# Patient Record
Sex: Female | Born: 1985 | Race: Black or African American | Hispanic: No | Marital: Single | Smoking: Never smoker
Health system: Southern US, Community
[De-identification: ages and names within clinical notes are randomized; demographics above are authoritative.]

---
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
## 2014-07-27 ENCOUNTER — Encounter (HOSPITAL_COMMUNITY): Payer: Self-pay | Admitting: Emergency Medicine

## 2014-07-27 ENCOUNTER — Emergency Department (HOSPITAL_COMMUNITY)
Admission: EM | Admit: 2014-07-27 | Discharge: 2014-07-27 | Disposition: A | Discharge status: Home / Self Care | Payer: Self-pay | Attending: Emergency Medicine | Admitting: Emergency Medicine

## 2014-07-27 DIAGNOSIS — IMO0001 Reserved for inherently not codable concepts without codable children: Secondary | ICD-10-CM | POA: Insufficient documentation

## 2014-07-27 DIAGNOSIS — Z79899 Other long term (current) drug therapy: Secondary | ICD-10-CM | POA: Insufficient documentation

## 2014-07-27 DIAGNOSIS — R5381 Other malaise: Secondary | ICD-10-CM | POA: Insufficient documentation

## 2014-07-27 DIAGNOSIS — B349 Viral infection, unspecified: Secondary | ICD-10-CM

## 2014-07-27 DIAGNOSIS — R6883 Chills (without fever): Secondary | ICD-10-CM | POA: Insufficient documentation

## 2014-07-27 DIAGNOSIS — J029 Acute pharyngitis, unspecified: Secondary | ICD-10-CM | POA: Insufficient documentation

## 2014-07-27 DIAGNOSIS — R197 Diarrhea, unspecified: Secondary | ICD-10-CM | POA: Insufficient documentation

## 2014-07-27 DIAGNOSIS — B9789 Other viral agents as the cause of diseases classified elsewhere: Secondary | ICD-10-CM | POA: Insufficient documentation

## 2014-07-27 DIAGNOSIS — R5383 Other fatigue: Secondary | ICD-10-CM

## 2014-07-27 LAB — RAPID STREP SCREEN (MED CTR MEBANE ONLY): Streptococcus, Group A Screen (Direct): NEGATIVE

## 2014-07-27 MED ORDER — HYDROCODONE-ACETAMINOPHEN 7.5-325 MG/15ML PO SOLN: 15.0000 mL | Freq: Four times a day (QID) | ORAL | Status: DC | PRN

## 2014-07-27 MED ORDER — HYDROCODONE-ACETAMINOPHEN 5-325 MG PO TABS: 1.0000 | ORAL_TABLET | Freq: Four times a day (QID) | ORAL | Status: DC | PRN

## 2014-07-27 NOTE — ED Notes (Signed)
Patient c/o generalized body aches, sore throat, fever/chills (unable to state temp), diarrhea, and red eyes. Patient states symptoms have been ongoing for several days (  week). Patient states she took severe multi symptom Robitussin, as well as cold and flu pills. States neither of these medications helped with her symptoms.

## 2014-07-27 NOTE — ED Provider Notes (Signed)
CSN: [id number]     Arrival date & time 07/27/14  0549 History   First MD Initiated Contact with Patient 07/27/14 [phone]     Chief Complaint  Patient presents with  . Sore Throat  . Diarrhea  . generalized body pain      (Consider location/radiation/quality/duration/timing/severity/associated sxs/prior Treatment) Patient is a 28 y.o. female presenting with pharyngitis and diarrhea. The history is provided by the patient.  Sore Throat This is a new problem. Pertinent negatives include no chest pain.  Diarrhea Associated symptoms: chills and myalgias    patient presents with sore throat for the last week. Aches all over. He had fevers to start with. Had some diarrhea and nausea. Throat is worse with swallowing. Occasional cough. No sick contacts. No relief with over-the-counter medicines.   History reviewed. No pertinent past medical history. History reviewed. No pertinent past surgical history. No family history on file. History  Substance Use Topics  . Smoking status: Never Smoker   . Smokeless tobacco: Not on file  . Alcohol Use: Yes     Comment: occ   OB History   Grav Para Term Preterm Abortions TAB SAB Ect Mult Living                 Review of Systems  Constitutional: Positive for chills, appetite change and fatigue.  Respiratory: Positive for cough.   Cardiovascular: Negative for chest pain.  Gastrointestinal: Positive for diarrhea.  Genitourinary: Negative for flank pain.  Musculoskeletal: Positive for myalgias.  Skin: Negative for wound.  Hematological: Negative for adenopathy.  Psychiatric/Behavioral: Negative for confusion.      Allergies  Review of patient's allergies indicates no known allergies.  Home Medications   Prior to Admission medications   Medication Sig Start Date End Date Taking? Authorizing Provider  dextromethorphan (ROBITUSSIN MAXIMUM STRENGTH) 15 MG/5ML syrup Take 10 mLs by mouth 4 (four) times daily as needed for cough.   Yes Historical  Provider, MD  PE-Doxylamine-DM-GG-APAP (SEVERE COLD & FLU DAY/NIGHT PO) Take 2 tablets by mouth every 6 (six) hours as needed (cough).   Yes Historical Provider, MD  HYDROcodone-acetaminophen (HYCET) 7.5-325 mg/15 ml solution Take 15 mLs by mouth every 6 (six) hours as needed for moderate pain. 07/27/14   Juliet Rude. Araya Roel, MD  HYDROcodone-acetaminophen (NORCO/VICODIN) 5-325 MG per tablet Take 1-2 tablets by mouth every 6 (six) hours as needed. 07/27/14   Juliet Rude. Joyel Chenette, MD   BP 108/71  Pulse 62  Temp(Src) 97.5 F (36.4 C) (Oral)  Resp 17  Ht  (1.803 m)  Wt 130 lb (58.968 kg)  BMI 18.14 kg/m2  SpO2 100%  LMP 07/13/2014 Physical Exam  Constitutional: She appears well-developed.  HENT:  Posterior pharyngeal erythema without exudate  Eyes: Pupils are equal, round, and reactive to light. Right eye exhibits no discharge. Left eye exhibits no discharge.  Conjunctival injection bilaterally without purulence  Neck: Neck supple.  Cardiovascular: Normal rate.   Pulmonary/Chest: Effort normal.  Abdominal: Soft. There is no tenderness.  Musculoskeletal: Normal range of motion.  Neurological: She is alert.  Skin: Skin is warm.    ED Course  Procedures (including critical care time) Labs Review Labs Reviewed  RAPID STREP SCREEN  CULTURE, GROUP A STREP    Imaging Review No results found.   EKG Interpretation None      MDM   Final diagnoses:  Viral infection    Patient with viral type symptoms for the last week. Negative strep. Lungs are clear without focal  findings. Negative strep test. Reassuring vitals. We'll give symptomatic relief.    Juliet Rude. Rubin Payor, MD 07/27/14 [phone]

## 2014-07-27 NOTE — Discharge Instructions (Signed)

## 2014-07-28 LAB — CULTURE, GROUP A STREP

## 2018-09-16 ENCOUNTER — Emergency Department (HOSPITAL_COMMUNITY)
Admission: EM | Admit: 2018-09-16 | Discharge: 2018-09-16 | Disposition: A | Discharge status: Home / Self Care | Payer: Self-pay | Attending: Emergency Medicine | Admitting: Emergency Medicine

## 2018-09-16 ENCOUNTER — Other Ambulatory Visit: Payer: Self-pay

## 2018-09-16 ENCOUNTER — Encounter (HOSPITAL_COMMUNITY): Payer: Self-pay | Admitting: Emergency Medicine

## 2018-09-16 DIAGNOSIS — N898 Other specified noninflammatory disorders of vagina: Secondary | ICD-10-CM | POA: Insufficient documentation

## 2018-09-16 LAB — WET PREP, GENITAL
Clue Cells Wet Prep HPF POC: NONE SEEN
SPERM: NONE SEEN
TRICH WET PREP: NONE SEEN
Yeast Wet Prep HPF POC: NONE SEEN

## 2018-09-16 LAB — URINALYSIS, ROUTINE W REFLEX MICROSCOPIC
Bilirubin Urine: NEGATIVE
Glucose, UA: NEGATIVE mg/dL
KETONES UR: NEGATIVE mg/dL
Nitrite: NEGATIVE
Protein, ur: NEGATIVE mg/dL
SPECIFIC GRAVITY, URINE: 1.02 (ref 1.005–1.030)
pH: 5 (ref 5.0–8.0)

## 2018-09-16 LAB — POC URINE PREG, ED: Preg Test, Ur: NEGATIVE

## 2018-09-16 MED ORDER — LIDOCAINE HCL 1 % IJ SOLN
INTRAMUSCULAR | Status: AC
  Administered 2018-09-16: 20 mL
  Filled 2018-09-16: qty 20

## 2018-09-16 MED ORDER — CEFTRIAXONE SODIUM 250 MG IJ SOLR
250.0000 mg | Freq: Once | INTRAMUSCULAR | Status: AC
  Administered 2018-09-16: 250 mg via INTRAMUSCULAR
  Filled 2018-09-16: qty 250

## 2018-09-16 MED ORDER — AZITHROMYCIN 250 MG PO TABS
1000.0000 mg | ORAL_TABLET | Freq: Once | ORAL | Status: AC
  Administered 2018-09-16: 1000 mg via ORAL
  Filled 2018-09-16 (×2): qty 4

## 2018-09-16 NOTE — ED Provider Notes (Signed)
[facility]  CSN: [id number] Arrival date & time: 09/16/18  1602   History   Chief Complaint Chief Complaint  Patient presents with  . Vaginal Discharge    HPI Tammy Wheeler is a 32 y.o. female with no significant medical history who presented to the ED for   Vaginal Discharge   This is a new problem. Episode onset: 4 days ago. The problem occurs constantly. The problem has not changed since onset.The discharge occurs spontaneously. The discharge was thin. She is not pregnant. She has not missed her period (LMP ended a couple of days ago). Associated symptoms include genital burning and genital itching. Pertinent negatives include no fever, no abdominal swelling, no abdominal pain, no constipation, no diarrhea, no nausea, no vomiting, no dyspareunia, no dysuria, no frequency, no genital lesions, no perineal pain and no perineal odor. Treatments tried: Monistat. The treatment provided no relief. Past medical history comments: yeast infections.    History reviewed. No pertinent past medical history.  There are no active problems to display for this patient.   History reviewed. No pertinent surgical history.   OB History   None      Home Medications    Prior to Admission medications   Medication Sig Start Date End Date Taking? Authorizing Provider  cetirizine (ZYRTEC) 5 MG tablet Take 5 mg by mouth daily as needed for allergies.   Yes [provider]  ibuprofen (ADVIL,MOTRIN) 200 MG tablet Take 400 mg by mouth every 6 (six) hours as needed for moderate pain.   Yes [provider]  HYDROcodone-acetaminophen (HYCET) 7.5-325 mg/15 ml solution Take 15 mLs by mouth every 6 (six) hours as needed for moderate pain. Patient not taking: Reported on 09/16/2018 07/27/14   Benjiman CorePickering, Nathan, MD  HYDROcodone-acetaminophen (NORCO/VICODIN) 5-325 MG per tablet Take 1-2 tablets by mouth every 6 (six) hours as needed. Patient not  taking: Reported on 09/16/2018 07/27/14   Benjiman CorePickering, Nathan, MD    Family History No family history on file.  Social History Social History   Tobacco Use  . Smoking status: Never Smoker  Substance Use Topics  . Alcohol use: Yes    Comment: occ  . Drug use: No     Allergies   Patient has no known allergies.   Review of Systems Review of Systems  Constitutional: Negative for fever.  Gastrointestinal: Negative for abdominal pain, constipation, diarrhea, nausea and vomiting.  Genitourinary: Positive for vaginal discharge. Negative for dyspareunia, dysuria, flank pain, frequency, genital sores, menstrual problem, pelvic pain, urgency and vaginal bleeding.  Musculoskeletal: Negative.   Skin: Negative.     Physical Exam Updated Vital Signs BP 119/86 (BP Location: Left Arm)   Pulse 71   Temp 98.3 F (36.8 C) (Oral)   Resp 18   LMP 09/16/2018   SpO2 100%   Physical Exam  Constitutional: She appears well-developed and well-nourished.  Abdominal: Soft. Normal appearance and bowel sounds are normal. There is no tenderness.  Genitourinary: Pelvic exam was performed with patient prone. Vaginal discharge found.  Genitourinary Comments: RN chaperone present. Thick, white discharge seen in vaginal walls. Scant blood seen as well. Cervical os closed. No CMT. Normal uterus and adnexa on bimanual exam.  Lymphadenopathy: No inguinal adenopathy noted on the right or left side.  Skin: Skin is warm. Capillary refill takes less than 2 seconds.  Nursing note and vitals reviewed.    ED Treatments / Results  Labs (all labs ordered are listed, but only abnormal  results are displayed) Labs Reviewed  WET PREP, GENITAL  URINALYSIS, ROUTINE W REFLEX MICROSCOPIC  POC URINE PREG, ED  GC/CHLAMYDIA PROBE AMP ([place]) NOT AT [facility]    EKG None  Radiology No results found.  Procedures Procedures (including critical care time)  Medications Ordered in ED Medications - No data to  display   Initial Impression / Assessment and Plan / ED Course  Triage vital signs and the nursing notes have been reviewed.  Pertinent labs & imaging results that were available during care of the patient were reviewed and considered in medical decision making (see chart for details).  Patient presents to the ED with 4 day history of vaginal discharge and itching without any urinary complaints. She is sexually active and endorses no concerns for STI, but agrees to GC/chlamydia swab only. Declines HIV/RPR testing.  Clinical Course as of Sep 16 1928  [facility] Sep 16, 2018  1853 Wet prep resulted and showed no yeast, trich or bacterial vaginosis. Will prophylactically treat patient for gonorrhea/chlamydia with Rocephin and Zithromax.   [GM]  1925 UA shows many leukocytes likely from WBCs seen on wet prep. Has no urinary complaints. Will send urine for culture. Many RBCs also seen which is consistent with physical exam findings and history that patient is at the end of her menstrual cycle.   [GM]    Clinical Course User Index [GM] Mortis, Sharyon Medicus, PA-C    Final Clinical Impressions(s) / ED Diagnoses  1. Vaginal Discharge. Prophylactically treat for gonorrhea/chlamydia with Rocephin and Zithromax.  Dispo: Home. After thorough clinical evaluation, this patient is determined to be medically stable and can be safely discharged with the previously mentioned treatment and/or outpatient follow-up/referral(s). At this time, there are no other apparent medical conditions that require further screening, evaluation or treatment.   Final diagnoses:  Vaginal discharge    ED Discharge Orders    None        Reva Bores 09/16/18 1929    Linwood Dibbles, MD 09/17/18 1558

## 2018-09-16 NOTE — Discharge Instructions (Addendum)
Your swab today was negative for trichomonas, bacterial vaginosis and yeast. It is possible that your symptoms are coming from a STI like gonorrhea or chlamydia. You have been treated for both of these things today. No additional medication is needed.   Your urine sample did not show that you had a UTI, but I sent this off for a culture. If your culture grows any bacteria, you will receive a call from our pharmacist.  Please abstain from sexual activity for the next seven (7) days and advise your partner(s) to get evaluated as well.  Thank you for allowing us to take care of you today.

## 2018-09-16 NOTE — ED Triage Notes (Signed)
Patient c/o vaginal swelling, redness, and discharge. Hx of same and dx with yeast infection. Reports using monostat at home with no relief.

## 2018-09-17 LAB — GC/CHLAMYDIA PROBE AMP (~~LOC~~) NOT AT ARMC
Chlamydia: NEGATIVE
Neisseria Gonorrhea: NEGATIVE

## 2018-09-18 LAB — URINE CULTURE

## 2018-11-04 ENCOUNTER — Emergency Department (HOSPITAL_COMMUNITY): Payer: Self-pay

## 2018-11-04 ENCOUNTER — Encounter (HOSPITAL_COMMUNITY): Payer: Self-pay

## 2018-11-04 ENCOUNTER — Other Ambulatory Visit: Payer: Self-pay

## 2018-11-04 ENCOUNTER — Other Ambulatory Visit (HOSPITAL_COMMUNITY): Payer: Self-pay

## 2018-11-04 DIAGNOSIS — R0602 Shortness of breath: Secondary | ICD-10-CM | POA: Insufficient documentation

## 2018-11-04 DIAGNOSIS — R002 Palpitations: Secondary | ICD-10-CM | POA: Insufficient documentation

## 2018-11-04 LAB — I-STAT BETA HCG BLOOD, ED (NOT ORDERABLE): I-stat hCG, quantitative: <5 m[IU]/mL (ref <5)

## 2018-11-04 LAB — BASIC METABOLIC PANEL
Anion gap: 8 (ref 5–15)
BUN: 14 mg/dL (ref 6–20)
CO2: 22 mmol/L (ref 22–32)
Calcium: 9.3 mg/dL (ref 8.9–10.3)
Chloride: 105 mmol/L (ref 98–111)
Creatinine, Ser: 0.82 mg/dL (ref 0.44–1.00)
GFR calc Af Amer: >60 mL/min (ref >60)
GLUCOSE: 93 mg/dL (ref 70–99)
POTASSIUM: 3.5 mmol/L (ref 3.5–5.1)
Sodium: 135 mmol/L (ref 135–145)

## 2018-11-04 LAB — POCT I-STAT TROPONIN I: Troponin i, poc: 0 ng/mL (ref 0.00–0.08)

## 2018-11-04 NOTE — ED Triage Notes (Signed)
Pt complains of an irregular heart beat for about three hours now, she states that it feels like it goes fast and then skips a beat

## 2018-11-05 ENCOUNTER — Emergency Department (HOSPITAL_COMMUNITY)
Admission: EM | Admit: 2018-11-05 | Discharge: 2018-11-05 | Disposition: A | Discharge status: Home / Self Care | Payer: Self-pay | Attending: Emergency Medicine | Admitting: Emergency Medicine

## 2018-11-05 DIAGNOSIS — R002 Palpitations: Secondary | ICD-10-CM

## 2018-11-05 LAB — CBC
HCT: 35.9 % — ABNORMAL LOW (ref 36.0–46.0)
Hemoglobin: 11.3 g/dL — ABNORMAL LOW (ref 12.0–15.0)
MCH: 28.3 pg (ref 26.0–34.0)
MCHC: 31.5 g/dL (ref 30.0–36.0)
MCV: 89.8 fL (ref 80.0–100.0)
PLATELETS: 226 10*3/uL (ref 150–400)
RBC: 4 MIL/uL (ref 3.87–5.11)
RDW: 14 % (ref 11.5–15.5)
WBC: 8.7 10*3/uL (ref 4.0–10.5)
nRBC: 0 % (ref 0.0–0.2)

## 2018-11-05 NOTE — ED Provider Notes (Signed)
[facility] Provider Note   CSN: [id number] Arrival date & time: 11/04/18  2043     History   Chief Complaint Chief Complaint  Patient presents with  . irregular heartrate    HPI Tammy Wheeler is a 33 y.o. female.  The history is provided by the patient.  Palpitations  Palpitations quality:  Fast Onset quality:  Sudden Timing:  Intermittent Progression:  Resolved Chronicity:  New Relieved by:  None tried Worsened by:  Nothing Associated symptoms: shortness of breath   Associated symptoms: no chest pain, no cough, no lower extremity edema, no syncope and no vomiting   Risk factors: no hx of DVT   Patient presents for palpitations.  She reports she had up to 2 episodes during the day that lasted for several hours.  She is now back to baseline.  No syncope.  She had mild shortness of breath.  She reports previous episodes in the past, but never this long.  She is never been evaluated for this before.  She is on OCPs.  She is a non-smoker. No other acute medical conditions reported   PMH-none Soc hx - denies smoking, ocassional ETOH use OB History   No obstetric history on file.      Home Medications    Prior to Admission medications   Medication Sig Start Date End Date Taking? Authorizing Provider  cetirizine (ZYRTEC) 5 MG tablet Take 5 mg by mouth daily as needed for allergies.   Yes [provider]  ibuprofen (ADVIL,MOTRIN) 200 MG tablet Take 400 mg by mouth every 6 (six) hours as needed for moderate pain.   Yes [provider]    Family History History reviewed. No pertinent family history.  Social History Social History   Tobacco Use  . Smoking status: Never Smoker  . Smokeless tobacco: Never Used  Substance Use Topics  . Alcohol use: Yes    Comment: occ  . Drug use: No     Allergies   Patient has no known allergies.   Review of Systems Review of Systems  Constitutional: Negative for fever.    Respiratory: Positive for shortness of breath. Negative for cough.   Cardiovascular: Positive for palpitations. Negative for chest pain and syncope.  Gastrointestinal: Negative for vomiting.  Neurological: Negative for syncope.  All other systems reviewed and are negative.    Physical Exam Updated Vital Signs BP 131/74 (BP Location: Right Arm)   Pulse 68   Temp (!) 97.4 F (36.3 C) (Oral)   Resp 15   Ht 1.778 m (5\' 10" )   Wt 71.8 kg   LMP 10/14/2018   SpO2 100%   BMI 22.73 kg/m   Physical Exam  CONSTITUTIONAL: Well developed/well nourished HEAD: Normocephalic/atraumatic EYES: EOMI/PERRL ENMT: Mucous membranes moist NECK: supple no meningeal signs SPINE/BACK:entire spine nontender CV: S1/S2 noted, no murmurs/rubs/gallops noted LUNGS: Lungs are clear to auscultation bilaterally, no apparent distress ABDOMEN: soft, nontender, no rebound or guarding, bowel sounds noted throughout abdomen GU:no cva tenderness NEURO: Pt is awake/alert/appropriate, moves all extremitiesx4.  No facial droop.   EXTREMITIES: pulses normal/equal, full ROM SKIN: warm, color normal PSYCH: no abnormalities of mood noted, alert and oriented to situation  ED Treatments / Results  Labs (all labs ordered are listed, but only abnormal results are displayed) Labs Reviewed  CBC - Abnormal; Notable for the following components:      Result Value   Hemoglobin 11.3 (*)    HCT 35.9 (*)    All  other components within normal limits  BASIC METABOLIC PANEL  POCT I-STAT TROPONIN I  I-STAT BETA HCG BLOOD, ED (NOT ORDERABLE)    EKG ED ECG REPORT   Date: 01/06//2020 20:58  Rate: 78  Rhythm: normal sinus rhythm  QRS Axis: right  Intervals: normal  ST/T Wave abnormalities: normal  Conduction Disutrbances:none  Narrative Interpretation:   Old EKG Reviewed: none available  I have personally reviewed the EKG tracing and agree with the computerized printout as noted.  Radiology Dg Chest 2  View  Result Date: 11/04/2018 CLINICAL DATA:  Irregular heart rate EXAM: CHEST - 2 VIEW COMPARISON:  None. FINDINGS: The heart size and mediastinal contours are within normal limits. Both lungs are clear. The visualized skeletal structures are unremarkable. IMPRESSION: No active cardiopulmonary disease. Electronically Signed   By: Jasmine Pang M.D.   On: 11/04/2018 22:46    Procedures Procedures   Medications Ordered in ED Medications - No data to display   Initial Impression / Assessment and Plan / ED Course  I have reviewed the triage vital signs and the nursing notes.  Pertinent labs & imaging results that were available during my care of the patient were reviewed by me and considered in my medical decision making (see chart for details).     Labs/EKG/chest x-ray are all reassuring.  Patient back to baseline.  Vitals are appropriate.  Will discharge home and refer to PCP.  She would benefit from Holter monitoring.  Final Clinical Impressions(s) / ED Diagnoses   Final diagnoses:  Palpitations    ED Discharge Orders    None       Zadie Rhine, MD 11/05/18 [phone]

## 2019-12-15 IMAGING — CR DG CHEST 2V
2 series · 2 of 2 positions shown · non-contrast
Comparison: None.

CLINICAL DATA: Irregular heart rate

EXAM:
CHEST - 2 VIEW

[w chest pa]
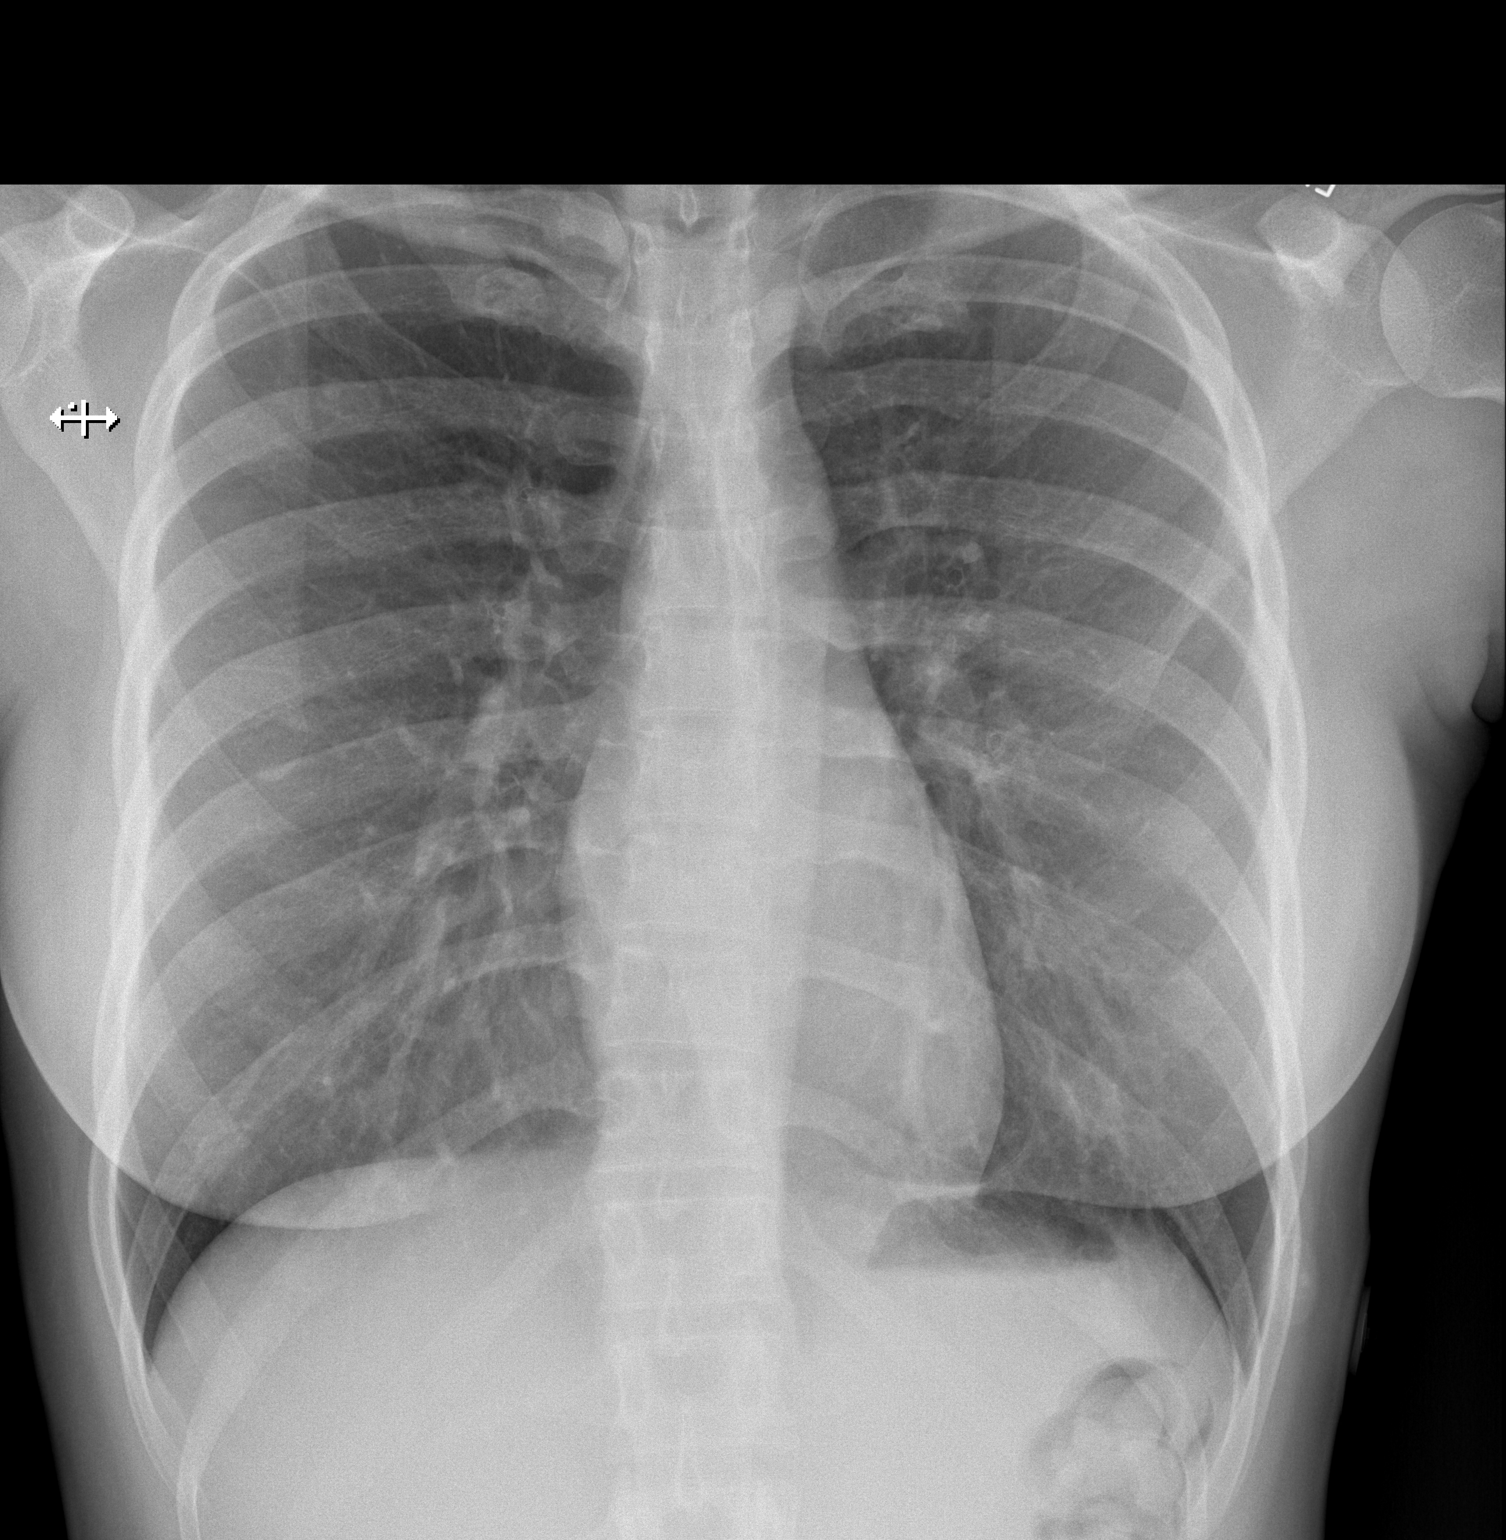

[w chest lat]
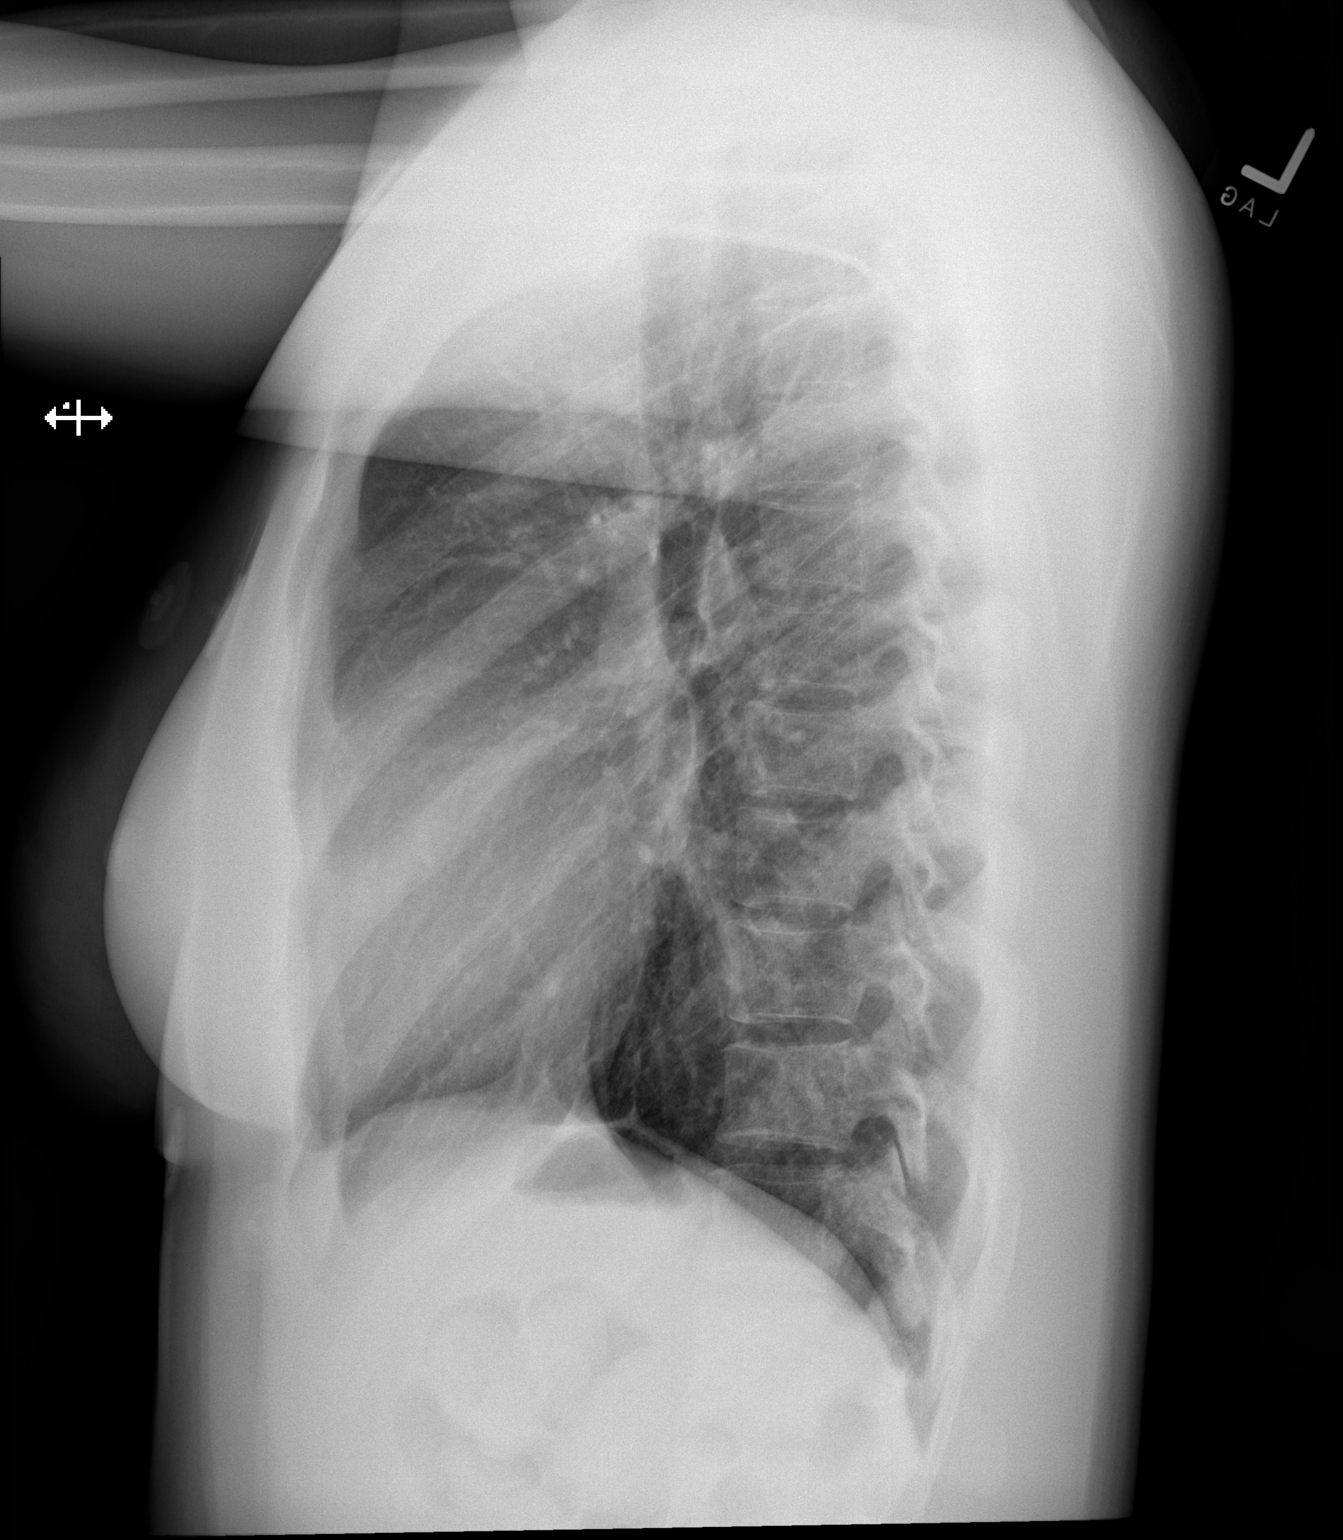

[2 of 2 positions shown; findings below may reference images not displayed]

FINDINGS: The heart size and mediastinal contours are within normal limits.
Both lungs are clear. The visualized skeletal structures are
unremarkable.
IMPRESSION: No active cardiopulmonary disease.

## 2022-11-30 DIAGNOSIS — Z419 Encounter for procedure for purposes other than remedying health state, unspecified: Secondary | ICD-10-CM | POA: Diagnosis not present

## 2022-12-06 DIAGNOSIS — H524 Presbyopia: Secondary | ICD-10-CM | POA: Diagnosis not present

## 2022-12-26 DIAGNOSIS — Z113 Encounter for screening for infections with a predominantly sexual mode of transmission: Secondary | ICD-10-CM | POA: Diagnosis not present

## 2022-12-26 DIAGNOSIS — L729 Follicular cyst of the skin and subcutaneous tissue, unspecified: Secondary | ICD-10-CM | POA: Diagnosis not present

## 2022-12-26 DIAGNOSIS — Z0001 Encounter for general adult medical examination with abnormal findings: Secondary | ICD-10-CM | POA: Diagnosis not present

## 2022-12-26 DIAGNOSIS — F410 Panic disorder [episodic paroxysmal anxiety] without agoraphobia: Secondary | ICD-10-CM | POA: Diagnosis not present

## 2022-12-26 DIAGNOSIS — R002 Palpitations: Secondary | ICD-10-CM | POA: Diagnosis not present

## 2022-12-26 DIAGNOSIS — Z136 Encounter for screening for cardiovascular disorders: Secondary | ICD-10-CM | POA: Diagnosis not present

## 2022-12-26 DIAGNOSIS — Z131 Encounter for screening for diabetes mellitus: Secondary | ICD-10-CM | POA: Diagnosis not present

## 2022-12-26 DIAGNOSIS — R519 Headache, unspecified: Secondary | ICD-10-CM | POA: Diagnosis not present

## 2022-12-26 DIAGNOSIS — F321 Major depressive disorder, single episode, moderate: Secondary | ICD-10-CM | POA: Diagnosis not present

## 2022-12-29 DIAGNOSIS — Z419 Encounter for procedure for purposes other than remedying health state, unspecified: Secondary | ICD-10-CM | POA: Diagnosis not present

## 2023-01-22 DIAGNOSIS — F321 Major depressive disorder, single episode, moderate: Secondary | ICD-10-CM | POA: Diagnosis not present

## 2023-01-22 DIAGNOSIS — Z124 Encounter for screening for malignant neoplasm of cervix: Secondary | ICD-10-CM | POA: Diagnosis not present

## 2023-01-22 DIAGNOSIS — R7303 Prediabetes: Secondary | ICD-10-CM | POA: Diagnosis not present

## 2023-01-22 DIAGNOSIS — G44209 Tension-type headache, unspecified, not intractable: Secondary | ICD-10-CM | POA: Diagnosis not present

## 2023-01-22 DIAGNOSIS — R002 Palpitations: Secondary | ICD-10-CM | POA: Diagnosis not present

## 2023-01-22 DIAGNOSIS — E785 Hyperlipidemia, unspecified: Secondary | ICD-10-CM | POA: Diagnosis not present

## 2023-01-22 DIAGNOSIS — Z0001 Encounter for general adult medical examination with abnormal findings: Secondary | ICD-10-CM | POA: Diagnosis not present

## 2023-01-22 DIAGNOSIS — F410 Panic disorder [episodic paroxysmal anxiety] without agoraphobia: Secondary | ICD-10-CM | POA: Diagnosis not present

## 2023-01-22 DIAGNOSIS — Z23 Encounter for immunization: Secondary | ICD-10-CM | POA: Diagnosis not present

## 2023-01-22 DIAGNOSIS — L729 Follicular cyst of the skin and subcutaneous tissue, unspecified: Secondary | ICD-10-CM | POA: Diagnosis not present

## 2023-01-25 ENCOUNTER — Other Ambulatory Visit: Payer: Self-pay | Admitting: *Deleted

## 2023-01-25 DIAGNOSIS — R002 Palpitations: Secondary | ICD-10-CM

## 2023-01-29 DIAGNOSIS — Z419 Encounter for procedure for purposes other than remedying health state, unspecified: Secondary | ICD-10-CM | POA: Diagnosis not present

## 2023-02-15 ENCOUNTER — Encounter: Payer: Self-pay | Admitting: Dermatology

## 2023-02-15 ENCOUNTER — Ambulatory Visit (INDEPENDENT_AMBULATORY_CARE_PROVIDER_SITE_OTHER): Payer: Medicaid Other | Admitting: Dermatology

## 2023-02-15 VITALS — BP 114/77

## 2023-02-15 DIAGNOSIS — L7211 Pilar cyst: Secondary | ICD-10-CM | POA: Diagnosis not present

## 2023-02-15 MED ORDER — DOXYCYCLINE HYCLATE 100 MG PO CAPS: 100.0000 mg | ORAL_CAPSULE | Freq: Every day | ORAL | 0 refills | Status: AC

## 2023-02-15 NOTE — Patient Instructions (Signed)
Due to recent changes in healthcare laws, you may see results of your pathology and/or laboratory studies on MyChart before the doctors have had a chance to review them. We understand that in some cases there may be results that are confusing or concerning to you. Please understand that not all results are received at the same time and often the doctors may need to interpret multiple results in order to provide you with the best plan of care or course of treatment. Therefore, we ask that you please give us 2 business days to thoroughly review all your results before contacting the office for clarification. Should we see a critical lab result, you will be contacted sooner.   If You Need Anything After Your Visit  If you have any questions or concerns for your doctor, please call our main line at [phone] If no one answers, please leave a voicemail as directed and we will return your call as soon as possible. Messages left after 4 pm will be answered the following business day.   You may also send us a message via MyChart. We typically respond to MyChart messages within 1-2 business days.  For prescription refills, please ask your pharmacy to contact our office. Our fax number is [phone].  If you have an urgent issue when the clinic is closed that cannot wait until the next business day, you can page your doctor at the number below.    Please note that while we do our best to be available for urgent issues outside of office hours, we are not available 24/7.   If you have an urgent issue and are unable to reach us, you may choose to seek medical care at your doctor's office, retail clinic, urgent care center, or emergency room.  If you have a medical emergency, please immediately call 911 or go to the emergency department. In the event of inclement weather, please call our main line at [phone] for an update on the status of any delays or closures.  Dermatology Medication Tips: Please  keep the boxes that topical medications come in in order to help keep track of the instructions about where and how to use these. Pharmacies typically print the medication instructions only on the boxes and not directly on the medication tubes.   If your medication is too expensive, please contact our office at [phone] or send us a message through MyChart.   We are unable to tell what your co-pay for medications will be in advance as this is different depending on your insurance coverage. However, we may be able to find a substitute medication at lower cost or fill out paperwork to get insurance to cover a needed medication.   If a prior authorization is required to get your medication covered by your insurance company, please allow us 1-2 business days to complete this process.  Drug prices often vary depending on where the prescription is filled and some pharmacies may offer cheaper prices.  The website www.goodrx.com contains coupons for medications through different pharmacies. The prices here do not account for what the cost may be with help from insurance (it may be cheaper with your insurance), but the website can give you the price if you did not use any insurance.  - You can print the associated coupon and take it with your prescription to the pharmacy.  - You may also stop by our office during regular business hours and pick up a GoodRx coupon card.  - If you need your   prescription sent electronically to a different pharmacy, notify our office through [facility] MyChart or by phone at [phone]     

## 2023-02-15 NOTE — Progress Notes (Signed)
   New Patient Visit   Subjective  Tammy Wheeler is a 37 y.o. female who presents for the following:   She has a tender lump under her skin at the right posterior occipital scalp x 3-4 years. It has grown a little. No prior treatment. No drainage or bleeding.   The following portions of the chart were reviewed this encounter and updated as appropriate: medications, allergies, medical history  Review of Systems:  No other skin or systemic complaints except as noted in HPI or Assessment and Plan.  Objective  Well appearing patient in no apparent distress; mood and affect are within normal limits.   A focused examination was performed of the following areas: Right scalp  Relevant exam findings are noted in the Assessment and Plan.    Assessment & Plan   Pilar Cyst Exam: Subcutaneous nodule at the right occipital scalp  Benign-appearing. 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 Discussed that a cyst is a benign growth that can grow over time and sometimes get irritated or inflamed. Recommend observation if it is not bothersome. Discussed option of surgical excision to remove it if it is growing, symptomatic, or other changes noted. Please call for new or changing lesions so they can be evaluated.   Discussed treatment options including an excision or treatment with oral antibiotics  Schedule surgical appointment in a 30 minutes slot at 12:30 Tuesday through Thursday  Doxycycline  daily at dinnertime for 3 weeks  No follow-ups on file.  Tammy Wheeler, CMA, am acting as scribe for Langston Reusing, MD.   Documentation: I have reviewed the above documentation for accuracy and completeness, and I agree with the above.  Langston Reusing, MD

## 2023-02-17 DIAGNOSIS — R002 Palpitations: Secondary | ICD-10-CM

## 2023-02-28 DIAGNOSIS — Z419 Encounter for procedure for purposes other than remedying health state, unspecified: Secondary | ICD-10-CM | POA: Diagnosis not present

## 2023-03-13 ENCOUNTER — Ambulatory Visit (INDEPENDENT_AMBULATORY_CARE_PROVIDER_SITE_OTHER): Payer: Medicaid Other | Admitting: Dermatology

## 2023-03-13 ENCOUNTER — Encounter: Payer: Self-pay | Admitting: Dermatology

## 2023-03-13 DIAGNOSIS — L7211 Pilar cyst: Secondary | ICD-10-CM

## 2023-03-13 DIAGNOSIS — D489 Neoplasm of uncertain behavior, unspecified: Secondary | ICD-10-CM

## 2023-03-13 MED ORDER — DOXYCYCLINE HYCLATE 100 MG PO TABS: 100.0000 mg | ORAL_TABLET | Freq: Two times a day (BID) | ORAL | 0 refills | Status: AC

## 2023-03-13 MED ORDER — MUPIROCIN 2 % EX OINT: 1.0000 | TOPICAL_OINTMENT | Freq: Two times a day (BID) | CUTANEOUS | 0 refills | Status: AC

## 2023-03-13 NOTE — Progress Notes (Signed)
   Follow-Up Visit   Subjective  Tammy Wheeler is a 37 y.o. female who presents for the following: Excision of Cyst  The following portions of the chart were reviewed this encounter and updated as appropriate: medications, allergies, medical history  Review of Systems:  No other skin or systemic complaints except as noted in HPI or Assessment and Plan.  Objective  Well appearing patient in no apparent distress; mood and affect are within normal limits.  A focused examination was performed of the following areas: Right Occipital Scalp  Relevant physical exam findings are noted in the Assessment and Plan.     Assessment & Plan   Pilar cyst Right Occipital Scalp  Skin excision - Right Occipital Scalp  Lesion length (cm):  0.6 Total excision diameter (cm):  0.6 Informed consent: discussed and consent obtained   Timeout: patient name, date of birth, surgical site, and procedure verified   Procedure prep:  Patient was prepped and draped in usual sterile fashion Prep type:  Isopropyl alcohol and povidone-iodine Anesthesia: the lesion was anesthetized in a standard fashion   Anesthetic:  1% lidocaine w/ epinephrine 1-100,000 buffered w/ 8.4% NaHCO3 Instrument used: #15 blade   Hemostasis achieved with: pressure   Hemostasis achieved with comment:  Electrocautery Outcome: patient tolerated procedure well with no complications   Post-procedure details: sterile dressing applied and wound care instructions given   Dressing type: bandage and pressure dressing (mupirocin)    Skin repair - Right Occipital Scalp Complexity:  Intermediate Final length (cm):  4 Timeout: patient name, date of birth, surgical site, and procedure verified   Procedure prep:  Patient was prepped and draped in usual sterile fashion Prep type:  Chlorhexidine Anesthesia: the lesion was anesthetized in a standard fashion   Anesthetic:  1% lidocaine w/ epinephrine 1-100,000 buffered w/ 8.4% NaHCO3 Reason for  type of repair: reduce tension to allow closure and reduce subcutaneous dead space and avoid a hematoma   Undermining: area extensively undermined   Undermining comment:  Undermining defect  Subcutaneous layers (deep stitches):  Suture size:  3-0 Suture type: Vicryl (polyglactin 910)   Stitches:  Running subcuticular Fine/surface layer approximation (top stitches):  Suture size:  3-0 Suture type: nylon   Stitches: simple interrupted   Suture removal (days):  7 Hemostasis achieved with: suture and pressure Outcome: patient tolerated procedure well with no complications   Post-procedure details: sterile dressing applied and wound care instructions given   Dressing type: bandage and pressure dressing (mupirocin)    Specimen 1 - Surgical pathology Differential Diagnosis: Rule out cyst   Check Margins: No     Return in about 2 weeks (around 03/27/2023) for suture removal.    Documentation: I have reviewed the above documentation for accuracy and completeness, and I agree with the above.  Langston Reusing, DO  I, Germaine Pomfret, CMA, am acting as scribe for [employer], DO.

## 2023-03-13 NOTE — Patient Instructions (Signed)
AFTER- CARE OF SURGERY SITE 1. Leave the current dressing on for 18-24 hours and do not get it wet during that time. 2. After 24 hours wash site normally with mild soap/cleanser. 3. Apply a small amount of Mupirocin Ointment (This is a prescription sent to your local pharmacy) to the area. 4. Cover with a bandage using a non stick gauze pad and paper tape or Band Aid(s). 5. Repeat this twice each day for about 7 days, or as instructed by your provider. 6. Take the 100mg Doxycyline every morning and evening with a bid meal for 5 days.  Should bleeding occur, apply pressure for 10-15 minutes to the site. If bleeding continues, apply pressure again  for 10-15 minutes. If bleeding persists despite applying consistent pressure, call the office or go to the nearest emergency room.  Call the office for any questions or concerns. NOTE: Pathology results will be reviewed at the time the sutures (stitches) are removed or you will be notified with  the results (which usually takes 7 days)  Supplies for surgical site care: 1. Non stick gauze pads and Band Aids 2. Mild soap/cleanser (Dove, Lever 2000, Cetaphil, etc.) 3. Paper tape 4. Prescription Mupirocin Ointment or  Aquaphor healing ointment if you're waiting for your prescription.   Due to recent changes in healthcare laws, you may see results of your pathology and/or laboratory studies on MyChart before the doctors have had a chance to review them. We understand that in some cases there may be results that are confusing or concerning to you. Please understand that not all results are received at the same time and often the doctors may need to interpret multiple results in order to provide you with the best plan of care or course of treatment. Therefore, we ask that you please give us 2 business days to thoroughly review all your results before contacting the office for clarification. Should we see a critical lab result, you will be  contacted sooner.   If You Need Anything After Your Visit  If you have any questions or concerns for your doctor, please call our main line at [phone] If no one answers, please leave a voicemail as directed and we will return your call as soon as possible. Messages left after 4 pm will be answered the following business day.   You may also send us a message via MyChart. We typically respond to MyChart messages within 1-2 business days.  For prescription refills, please ask your pharmacy to contact our office. Our fax number is [phone].  If you have an urgent issue when the clinic is closed that cannot wait until the next business day, you can page your doctor at the number below.    Please note that while we do our best to be available for urgent issues outside of office hours, we are not available 24/7.   If you have an urgent issue and are unable to reach us, you may choose to seek medical care at your doctor's office, retail clinic, urgent care center, or emergency room.  If you have a medical emergency, please immediately call 911 or go to the emergency department. In the event of inclement weather, please call our main line at [phone] for an update on the status of any delays or closures.  Dermatology Medication Tips: Please keep the boxes that topical medications come in in order to help keep track of the instructions about where and how to use these. Pharmacies typically print the medication instructions   only on the boxes and not directly on the medication tubes.   If your medication is too expensive, please contact our office at [phone] or send us a message through MyChart.   We are unable to tell what your co-pay for medications will be in advance as this is different depending on your insurance coverage. However, we may be able to find a substitute medication at lower cost or fill out paperwork to get insurance to cover a needed medication.   If a prior  authorization is required to get your medication covered by your insurance company, please allow us 1-2 business days to complete this process.  Drug prices often vary depending on where the prescription is filled and some pharmacies may offer cheaper prices.  The website www.goodrx.com contains coupons for medications through different pharmacies. The prices here do not account for what the cost may be with help from insurance (it may be cheaper with your insurance), but the website can give you the price if you did not use any insurance.  - You can print the associated coupon and take it with your prescription to the pharmacy.  - You may also stop by our office during regular business hours and pick up a GoodRx coupon card.  - If you need your prescription sent electronically to a different pharmacy, notify our office through  MyChart or by phone at [phone]     

## 2023-03-27 ENCOUNTER — Ambulatory Visit: Payer: Medicaid Other | Attending: Family Medicine

## 2023-03-27 DIAGNOSIS — R002 Palpitations: Secondary | ICD-10-CM

## 2023-03-28 ENCOUNTER — Encounter: Payer: Self-pay | Admitting: Dermatology

## 2023-03-28 ENCOUNTER — Ambulatory Visit (INDEPENDENT_AMBULATORY_CARE_PROVIDER_SITE_OTHER): Payer: Medicaid Other | Admitting: Dermatology

## 2023-03-28 DIAGNOSIS — Z4802 Encounter for removal of sutures: Secondary | ICD-10-CM

## 2023-03-28 NOTE — Progress Notes (Signed)
   Follow-Up Visit   Subjective  Timaya Schut is a 37 y.o. female who presents for the following: Suture removal  Pathology showed Neurofibroma  The following portions of the chart were reviewed this encounter and updated as appropriate: medications, allergies, medical history  Review of Systems:  No other skin or systemic complaints except as noted in HPI or Assessment and Plan.  Objective  Well appearing patient in no apparent distress; mood and affect are within normal limits.  Areas Examined: Right occipital scalp Relevant physical exam findings are noted in the Assessment and Plan.    Assessment & Plan    Encounter for Removal of Sutures - Incision site is clean, dry and intact. - Wound cleansed, sutures removed,  - Discussed pathology results showing Neurofibroma. - Scars remodel for a full year. -Discussed massaging area daily with Mupirocin to aid in healing process. - Patient advised to call with any concerns or if they notice any new or changing lesions.  No follow-ups on file.  Thressa Sheller Nyqvist

## 2023-04-03 DIAGNOSIS — E785 Hyperlipidemia, unspecified: Secondary | ICD-10-CM | POA: Diagnosis not present

## 2023-04-03 DIAGNOSIS — R7303 Prediabetes: Secondary | ICD-10-CM | POA: Diagnosis not present

## 2023-04-04 ENCOUNTER — Encounter: Payer: Self-pay | Admitting: Dermatology

## 2023-06-26 DIAGNOSIS — N926 Irregular menstruation, unspecified: Secondary | ICD-10-CM | POA: Insufficient documentation

## 2023-06-26 DIAGNOSIS — N898 Other specified noninflammatory disorders of vagina: Secondary | ICD-10-CM | POA: Insufficient documentation

## 2023-06-26 DIAGNOSIS — N762 Acute vulvitis: Secondary | ICD-10-CM | POA: Insufficient documentation

## 2023-06-27 ENCOUNTER — Ambulatory Visit: Payer: Medicaid Other | Admitting: Family Medicine

## 2024-04-12 ENCOUNTER — Encounter (HOSPITAL_COMMUNITY): Payer: Self-pay

## 2024-04-12 ENCOUNTER — Other Ambulatory Visit: Payer: Self-pay

## 2024-04-12 ENCOUNTER — Emergency Department (HOSPITAL_COMMUNITY)
Admission: EM | Admit: 2024-04-12 | Discharge: 2024-04-12 | Discharge status: Against Medical Advice | Attending: Emergency Medicine | Admitting: Emergency Medicine

## 2024-04-12 DIAGNOSIS — M436 Torticollis: Secondary | ICD-10-CM | POA: Diagnosis not present

## 2024-04-12 DIAGNOSIS — M542 Cervicalgia: Secondary | ICD-10-CM | POA: Diagnosis present

## 2024-04-12 DIAGNOSIS — Z5321 Procedure and treatment not carried out due to patient leaving prior to being seen by health care provider: Secondary | ICD-10-CM | POA: Insufficient documentation

## 2024-04-12 NOTE — ED Notes (Signed)
 This pt asked to be taken off the waiting list at this time.

## 2024-04-12 NOTE — ED Triage Notes (Signed)
 Pt reports left side neck pain that has been radiating to arm for the last three weeks. Pt reports this has happened intermittently for the last several years and would like to get it checked out due to the pain not getting better. Pt denies any recent injury

## 2024-05-16 ENCOUNTER — Encounter: Payer: Self-pay | Admitting: Advanced Practice Midwife
